# Patient Record
Sex: Male | Born: 1985 | Race: Black or African American | Hispanic: No | Marital: Single | Smoking: Current every day smoker
Health system: Southern US, Community
[De-identification: ages and names within clinical notes are randomized; demographics above are authoritative.]

## PROBLEM LIST (undated history)

## (undated) DIAGNOSIS — F32A Depression, unspecified: Secondary | ICD-10-CM

## (undated) DIAGNOSIS — Z21 Asymptomatic human immunodeficiency virus [HIV] infection status: Secondary | ICD-10-CM

## (undated) DIAGNOSIS — F329 Major depressive disorder, single episode, unspecified: Secondary | ICD-10-CM

## (undated) DIAGNOSIS — B20 Human immunodeficiency virus [HIV] disease: Secondary | ICD-10-CM

## (undated) HISTORY — PX: FOOT SURGERY: SHX648

## (undated) HISTORY — PX: HAND SURGERY: SHX662

---
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
## 2011-07-27 ENCOUNTER — Emergency Department (HOSPITAL_COMMUNITY)
Admission: EM | Admit: 2011-07-27 | Discharge: 2011-07-27 | Disposition: A | Discharge status: Home / Self Care | Payer: Self-pay | Attending: Emergency Medicine | Admitting: Emergency Medicine

## 2011-07-27 ENCOUNTER — Emergency Department (HOSPITAL_COMMUNITY): Payer: Self-pay

## 2011-07-27 DIAGNOSIS — S62329A Displaced fracture of shaft of unspecified metacarpal bone, initial encounter for closed fracture: Secondary | ICD-10-CM | POA: Insufficient documentation

## 2011-07-27 DIAGNOSIS — W219XXA Striking against or struck by unspecified sports equipment, initial encounter: Secondary | ICD-10-CM | POA: Insufficient documentation

## 2011-07-27 DIAGNOSIS — M79609 Pain in unspecified limb: Secondary | ICD-10-CM | POA: Insufficient documentation

## 2011-07-27 DIAGNOSIS — Y92838 Other recreation area as the place of occurrence of the external cause: Secondary | ICD-10-CM | POA: Insufficient documentation

## 2011-07-27 DIAGNOSIS — Y9367 Activity, basketball: Secondary | ICD-10-CM | POA: Insufficient documentation

## 2011-07-27 DIAGNOSIS — M7989 Other specified soft tissue disorders: Secondary | ICD-10-CM | POA: Insufficient documentation

## 2011-07-27 DIAGNOSIS — S6990XA Unspecified injury of unspecified wrist, hand and finger(s), initial encounter: Secondary | ICD-10-CM | POA: Insufficient documentation

## 2011-07-27 DIAGNOSIS — Y9239 Other specified sports and athletic area as the place of occurrence of the external cause: Secondary | ICD-10-CM | POA: Insufficient documentation

## 2011-08-10 ENCOUNTER — Ambulatory Visit (HOSPITAL_COMMUNITY)
Admission: RE | Admit: 2011-08-10 | Discharge: 2011-08-10 | Disposition: A | Discharge status: Home / Self Care | Payer: Self-pay | Source: Ambulatory Visit | Attending: Orthopedic Surgery | Admitting: Orthopedic Surgery

## 2014-04-12 DIAGNOSIS — Y9239 Other specified sports and athletic area as the place of occurrence of the external cause: Secondary | ICD-10-CM | POA: Insufficient documentation

## 2014-04-12 DIAGNOSIS — X500XXA Overexertion from strenuous movement or load, initial encounter: Secondary | ICD-10-CM | POA: Insufficient documentation

## 2014-04-12 DIAGNOSIS — Y9361 Activity, american tackle football: Secondary | ICD-10-CM | POA: Insufficient documentation

## 2014-04-12 DIAGNOSIS — IMO0002 Reserved for concepts with insufficient information to code with codable children: Secondary | ICD-10-CM | POA: Insufficient documentation

## 2014-04-12 DIAGNOSIS — Y92838 Other recreation area as the place of occurrence of the external cause: Secondary | ICD-10-CM

## 2014-04-13 ENCOUNTER — Emergency Department (HOSPITAL_COMMUNITY): Payer: Self-pay

## 2014-04-13 ENCOUNTER — Emergency Department (HOSPITAL_COMMUNITY)
Admission: EM | Admit: 2014-04-13 | Discharge: 2014-04-13 | Disposition: A | Discharge status: Home / Self Care | Payer: Self-pay | Attending: Emergency Medicine | Admitting: Emergency Medicine

## 2014-04-13 DIAGNOSIS — S46912A Strain of unspecified muscle, fascia and tendon at shoulder and upper arm level, left arm, initial encounter: Secondary | ICD-10-CM

## 2014-04-13 NOTE — ED Provider Notes (Signed)
CSN: [id number]     Arrival date & time 04/12/14  2334 History   First MD Initiated Contact with Patient 04/13/14 0205     Chief Complaint  Patient presents with  . Shoulder Injury     (Consider location/radiation/quality/duration/timing/severity/associated sxs/prior Treatment) HPI Comments: 28 year old healthy male presents with left shoulder pain. Patient was playing football and was on the ground in his left arm twisted back and felt pain radiating down his arm and subluxation. No history of shoulder surgeries. Pain with range of motion. No other injuries. No current prior to arrival  Patient is a 28 y.o. male presenting with shoulder injury. The history is provided by the patient.  Shoulder Injury    No past medical history on file. No past surgical history on file. No family history on file. History  Substance Use Topics  . Smoking status: Not on file  . Smokeless tobacco: Not on file  . Alcohol Use: Not on file    Review of Systems  Musculoskeletal: Positive for arthralgias. Negative for joint swelling.  Neurological: Negative for weakness and numbness.      Allergies  Review of patient's allergies indicates not on file.  Home Medications   Prior to Admission medications   Not on File   BP 133/80  Pulse 65  Temp(Src) 98.2 F (36.8 C) (Oral)  Resp 16  Ht 5\' 11"  (1.803 m)  Wt 196 lb 6 oz (89.075 kg)  BMI 27.40 kg/m2  SpO2 99% Physical Exam  Nursing note and vitals reviewed. Constitutional: He appears well-developed and well-nourished. No distress.  HENT:  Head: Normocephalic and atraumatic.  Cardiovascular: Normal rate.   Pulmonary/Chest: Effort normal.  Musculoskeletal: He exhibits tenderness. He exhibits no edema.  Patient has tenderness mild anterior left shoulder joint, full range of motion of the shoulder with pain with internal range of motion, neurovascularly intact distal arm  Neurological: He is alert.  Skin: Skin is warm.    ED Course   Procedures (including critical care time) Labs Review Labs Reviewed - No data to display  Imaging Review Dg Shoulder Left  04/13/2014   CLINICAL DATA:  Landed on left shoulder while playing football. Left anterior shoulder pain.  EXAM: LEFT SHOULDER - 2+ VIEW  COMPARISON:  None.  FINDINGS: There is no evidence of fracture or dislocation. The left humeral head is seated within the glenoid fossa. The acromioclavicular joint is unremarkable in appearance. No significant soft tissue abnormalities are seen. The visualized portions of the left lung are clear.  IMPRESSION: No evidence of fracture or dislocation.   Electronically Signed   By: Roanna Raider M.D.   On: 04/13/2014 00:48     EKG Interpretation None      MDM   Final diagnoses:  Left shoulder strain   Low risk injury with no fracture on x-ray. X-ray reviewed. Discussed supportive care and ibuprofen. Musculoskeletal strain versus subluxation.  Results and differential diagnosis were discussed with the patient/parent/guardian. Close follow up outpatient was discussed, comfortable with the plan.   Filed Vitals:   04/13/14 0008  BP: 133/80  Pulse: 65  Temp: 98.2 F (36.8 C)  TempSrc: Oral  Resp: 16  Height: 5\' 11"  (1.803 m)  Weight: 196 lb 6 oz (89.075 kg)  SpO2: 99%   Or her      Enid Skeens, MD 04/13/14 [phone]

## 2014-04-13 NOTE — ED Notes (Signed)
Pt states that he is having left shoulder pain following playing football earlier today.  Pt states he has plate and rods in his left arm from previous injuries.  Pt talking in clear complete sentences

## 2014-04-13 NOTE — Discharge Instructions (Signed)
Take ibuprofen every 6 hours as needed for pain and inflammation Use ice initially and then gradually increase range of motion and stretches.  If you were given medicines take as directed.  If you are on coumadin or contraceptives realize their levels and effectiveness is altered by many different medicines.  If you have any reaction (rash, tongues swelling, other) to the medicines stop taking and see a physician.   Please follow up as directed and return to the ER or see a physician for new or worsening symptoms.  Thank you. Filed Vitals:   04/13/14 0008  BP: 133/80  Pulse: 65  Temp: 98.2 F (36.8 C)  TempSrc: Oral  Resp: 16  Height: 5\' 11"  (1.803 m)  Weight: 196 lb 6 oz (89.075 kg)  SpO2: 99%   Pupils are
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
## 2017-07-02 ENCOUNTER — Encounter: Payer: Self-pay | Admitting: Internal Medicine

## 2017-07-02 ENCOUNTER — Other Ambulatory Visit: Payer: Self-pay | Admitting: Internal Medicine

## 2017-07-02 ENCOUNTER — Ambulatory Visit (INDEPENDENT_AMBULATORY_CARE_PROVIDER_SITE_OTHER): Payer: Self-pay | Admitting: Licensed Clinical Social Worker

## 2017-07-02 ENCOUNTER — Ambulatory Visit (INDEPENDENT_AMBULATORY_CARE_PROVIDER_SITE_OTHER): Payer: Self-pay | Admitting: Internal Medicine

## 2017-07-02 VITALS — BP 95/56 | HR 53 | Temp 98.3°F | Wt 179.0 lb

## 2017-07-02 DIAGNOSIS — Z23 Encounter for immunization: Secondary | ICD-10-CM

## 2017-07-02 DIAGNOSIS — B20 Human immunodeficiency virus [HIV] disease: Secondary | ICD-10-CM

## 2017-07-02 DIAGNOSIS — F4320 Adjustment disorder, unspecified: Secondary | ICD-10-CM

## 2017-07-02 DIAGNOSIS — F32A Depression, unspecified: Secondary | ICD-10-CM

## 2017-07-02 DIAGNOSIS — F329 Major depressive disorder, single episode, unspecified: Secondary | ICD-10-CM

## 2017-07-02 LAB — CBC WITH DIFFERENTIAL/PLATELET
Basophils Absolute: 0 cells/uL (ref 0–200)
Basophils Relative: 0 %
Eosinophils Absolute: 74 cells/uL (ref 15–500)
Eosinophils Relative: 2 %
HCT: 47.2 % (ref 38.5–50.0)
Hemoglobin: 15.9 g/dL (ref 13.2–17.1)
Lymphocytes Relative: 55 %
Lymphs Abs: 2035 cells/uL (ref 850–3900)
MCH: 30.3 pg (ref 27.0–33.0)
MCHC: 33.7 g/dL (ref 32.0–36.0)
MCV: 90.1 fL (ref 80.0–100.0)
MPV: 8.9 fL (ref 7.5–12.5)
Monocytes Absolute: 592 cells/uL (ref 200–950)
Monocytes Relative: 16 %
Neutro Abs: 999 cells/uL — ABNORMAL LOW (ref 1500–7800)
Neutrophils Relative %: 27 %
Platelets: 230 10*3/uL (ref 140–400)
RBC: 5.24 MIL/uL (ref 4.20–5.80)
RDW: 15.4 % — ABNORMAL HIGH (ref 11.0–15.0)
WBC: 3.7 10*3/uL — ABNORMAL LOW (ref 3.8–10.8)

## 2017-07-02 MED ORDER — BICTEGRAVIR-EMTRICITAB-TENOFOV 50-200-25 MG PO TABS: 1.0000 | ORAL_TABLET | Freq: Every day | ORAL | 5 refills | Status: DC

## 2017-07-02 MED FILL — BIKTARVY 50-200-25 MG TABS: 50-200-25 | 30 days supply | Qty: 30 | Fill #0

## 2017-07-02 NOTE — Progress Notes (Signed)
RFV: newly diagnosed  hiv patient Patient ID: George Rivers, male   DOB: 07/16/1986, 31 y.o.   MRN: [id number]  HPI George Rivers is a 31yo AAM with newly diagnosed HIV disease this Summer 2018. He has had sporadic testing as an adult, he last tested for hiv disease in 2011 with a negative test. He went for STI/HIV testing in July 2018 which was positive. He was tested since his girlfriend had chlamydia but he was not symptomatic at that time.  RF = has sex with women. He has disclosed to his girlfriend that he has HIV. In the last 3 months had 2 partners. In 2018 - only 3 partners. Does not recall any of his partners being HIV + ( but had a "girlfriend who had HIV+baby daddy" several yearsr ago). ? Prison tattoos.   Soc HX: no illicit drugs, no IVDU, does rarely drink, but smokes 1ppd. works at [employer], at [employer] . 40hr/ week.   ROS: + nightsweat, loss of appetite, loss of weight #20 in the last month (baseline weight 98)  Pmhx: -anxiety/depress  No outpatient encounter prescriptions on file as of 07/02/2017.   No facility-administered encounter medications on file as of 07/02/2017.      There are no active problems to display for this patient.  Sochx: Social History  Substance Use Topics  . Smoking status: Current Every Day Smoker    Types: Cigarettes  . Smokeless tobacco: Former Neurosurgeon  . Alcohol use Yes     Comment: Quitting    Family hx: Grandmother schizophrenia Mother - healthy Father - died of stroke, hypertension  Health Maintenance Due  Topic Date Due  . HIV Screening  03/13/2002  . TETANUS/TDAP  03/13/2006  . INFLUENZA VACCINE  06/19/2017     Physical Exam   BP (!) 95/56   Pulse (!) 53   Temp 98.3 F (36.8 C)   Wt 179 lb (81.2 kg)   BMI 24.97 kg/m   Physical Exam  Constitutional: He is oriented to person, place, and time. He appears well-developed and well-nourished. No distress.  HENT:  Mouth/Throat: Oropharynx is clear and moist. No oropharyngeal  exudate.  Cardiovascular: Normal rate, regular rhythm and normal heart sounds. Exam reveals no gallop and no friction rub.  No murmur heard.  Pulmonary/Chest: Effort normal and breath sounds normal. No respiratory distress. He has no wheezes.  Abdominal: Soft. Bowel sounds are normal. He exhibits no distension. There is no tenderness.  Lymphadenopathy:  He has no cervical adenopathy.  Neurological: He is alert and oriented to person, place, and time.  Skin: Skin is warm and dry. No rash noted. No erythema.  Psychiatric: He has a normal mood and affect. His behavior is normal.   Labs: 06/12/2017 HIV+  Assessment and Plan   hiv disease = spent majority of visit discussing hiv disease, transmission risk, and treatment. We will get baseline labs. Plan to start biktarvy. Will have him meet with clinical pharmacist to help out with adherence counseling  Health maintenance = hep  b #1 and pneumococcal vacccine today. Reviewed his state imms records, where he had only received 2 hep B vaccines. Will check hep B S Ab.  Risk of transmission = gave recs on minimize risk of giving hiv to his sexual partners presently since he is off of treatment.   Depression/anxiety = will have him meet with clinic counselor for support  Spent 60 min with greater than 50% in face to face time counseling in regards  to hiv disease
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
## 2017-07-02 NOTE — Progress Notes (Signed)
HPI: Livingston DionesMarcus L Orrison is a 31 y.o. male who presents to the [facility] clinic today as a new HIV patient to initiate care with Dr. Drue SecondSnider.  Allergies: Not on File  Past Medical History: No past medical history on file.  Social History: Social History   Social History  . Marital status: Single    Spouse name: N/A  . Number of children: N/A  . Years of education: N/A   Social History Main Topics  . Smoking status: Current Every Day Smoker    Types: Cigarettes  . Smokeless tobacco: Former NeurosurgeonUser  . Alcohol use Yes     Comment: Quitting   . Drug use: Yes    Types: Marijuana  . Sexual activity: No   Other Topics Concern  . None   Social History Narrative  . None    Current Regimen: None  Assessment: Berna SpareMarcus is here today to initiate care for his newly diagnosed HIV infection.  He tested HIV + in July at the health department.  I spent some time going over the different medication options we have for HIV. We do not have any baseline labs on him available today.  After discussion with Dr. Drue SecondSnider and the patient, we will start Biktarvy.  He has no insurance, so he will apply for ADAP with Marcelino DusterMichelle.  I called and got a 30 day supply of Biktarvy for him through [facility].  I spent time going over [place] with him and the importance of not missing any doses.  I discussed resistance and what can happen if he decides to take it on and off.  He states he is ready to start today and will take it every day.  He is on no other medications. I also spent some time explaining HIV viral loads and his CD4 count and immune system.  He asked if Susanne BordersBiktarvy will help him gain weight, and I explained that being on medications to suppress his viral load and increase his CD4 count will help with that all together.  I also went over the most common side effects with Biktarvy and gave him my card to call me if he needs anything.  I will bring him back in 3 weeks to make sure there is no gap between his 30 day supply  and ADAP approval.   Plans: - Start Biktarvy PO once daily - HIV labs today per Dr. Drue SecondSnider - [facility]  Radley Barto L. Jahna Liebert, PharmD, CPP Infectious Diseases Clinical Pharmacist [facility] 07/02/2017, 11:13 AM

## 2017-07-03 DIAGNOSIS — F329 Major depressive disorder, single episode, unspecified: Secondary | ICD-10-CM | POA: Insufficient documentation

## 2017-07-03 DIAGNOSIS — B20 Human immunodeficiency virus [HIV] disease: Secondary | ICD-10-CM | POA: Insufficient documentation

## 2017-07-03 DIAGNOSIS — F32A Depression, unspecified: Secondary | ICD-10-CM | POA: Insufficient documentation

## 2017-07-03 LAB — T-HELPER CELL (CD4) - (RCID CLINIC ONLY)
CD4 % Helper T Cell: 17 % — ABNORMAL LOW (ref 33–55)
CD4 T CELL ABS: 370 /uL — AB (ref 400–2700)

## 2017-07-03 LAB — COMPLETE METABOLIC PANEL WITH GFR
ALBUMIN: 4.3 g/dL (ref 3.6–5.1)
ALK PHOS: 60 U/L (ref 40–115)
ALT: 25 U/L (ref 9–46)
AST: 28 U/L (ref 10–40)
BUN: 13 mg/dL (ref 7–25)
CALCIUM: 9.7 mg/dL (ref 8.6–10.3)
CHLORIDE: 107 mmol/L (ref 98–110)
CO2: 26 mmol/L (ref 20–32)
CREATININE: 1.16 mg/dL (ref 0.60–1.35)
GFR, Est African American: >89 mL/min (ref >=60)
GFR, Est Non African American: 84 mL/min (ref >=60)
GLUCOSE: 95 mg/dL (ref 65–99)
Potassium: 4.5 mmol/L (ref 3.5–5.3)
SODIUM: 143 mmol/L (ref 135–146)
Total Bilirubin: 0.8 mg/dL (ref 0.2–1.2)
Total Protein: 7.3 g/dL (ref 6.1–8.1)

## 2017-07-03 LAB — HEPATITIS B SURFACE ANTIBODY,QUALITATIVE: Hep B S Ab: REACTIVE — AB

## 2017-07-03 LAB — HEPATITIS C ANTIBODY: HCV AB: NONREACTIVE

## 2017-07-03 LAB — HEPATITIS A ANTIBODY, TOTAL: Hep A Total Ab: REACTIVE — AB

## 2017-07-03 LAB — RPR

## 2017-07-03 LAB — HEPATITIS B SURFACE ANTIGEN: Hepatitis B Surface Ag: NONREACTIVE

## 2017-07-05 LAB — HIV-1 RNA,QN PCR W/REFLEX GENOTYPE
HIV-1 RNA, QN PCR: 22100 {copies}/mL — AB
HIV-1 RNA, QN PCR: 4.34 {Log_copies}/mL — AB

## 2017-07-24 ENCOUNTER — Ambulatory Visit: Payer: Medicaid Other

## 2017-07-29 ENCOUNTER — Ambulatory Visit: Payer: Self-pay

## 2017-07-31 ENCOUNTER — Other Ambulatory Visit: Payer: Self-pay | Admitting: *Deleted

## 2017-07-31 DIAGNOSIS — B20 Human immunodeficiency virus [HIV] disease: Secondary | ICD-10-CM

## 2017-07-31 MED ORDER — BICTEGRAVIR-EMTRICITAB-TENOFOV 50-200-25 MG PO TABS: 1.0000 | ORAL_TABLET | Freq: Every day | ORAL | 1 refills | Status: DC

## 2017-07-31 MED ORDER — BICTEGRAVIR-EMTRICITAB-TENOFOV 50-200-25 MG PO TABS: 1.0000 | ORAL_TABLET | Freq: Every day | ORAL | 5 refills | Status: DC

## 2017-08-05 ENCOUNTER — Other Ambulatory Visit: Payer: Self-pay

## 2017-08-05 ENCOUNTER — Other Ambulatory Visit: Payer: Self-pay | Admitting: *Deleted

## 2017-08-05 DIAGNOSIS — B20 Human immunodeficiency virus [HIV] disease: Secondary | ICD-10-CM

## 2017-08-05 MED ORDER — BICTEGRAVIR-EMTRICITAB-TENOFOV 50-200-25 MG PO TABS: 1.0000 | ORAL_TABLET | Freq: Every day | ORAL | 5 refills | Status: DC

## 2017-08-06 LAB — T-HELPER CELL (CD4) - (RCID CLINIC ONLY)
CD4 % Helper T Cell: 14 % — ABNORMAL LOW (ref 33–55)
CD4 T CELL ABS: 280 /uL — AB (ref 400–2700)

## 2017-08-07 LAB — HIV-1 RNA QUANT-NO REFLEX-BLD
HIV 1 RNA Quant: <20 copies/mL
HIV-1 RNA Quant, Log: <1.3 Log copies/mL

## 2017-08-12 ENCOUNTER — Telehealth: Payer: Self-pay | Admitting: Pharmacy Technician

## 2017-08-12 NOTE — Telephone Encounter (Signed)
The pharmacy and I called all phone numbers multiple times and dates.    No voicemail, no answer.    Overdue to refill Biktarvy by 11 days.

## 2017-08-19 LAB — HIV-1 GENOTYPE: HIV-1 Genotype: DETECTED — AB

## 2017-08-20 ENCOUNTER — Ambulatory Visit: Payer: Medicaid Other | Admitting: Internal Medicine

## 2017-08-26 ENCOUNTER — Encounter: Payer: Self-pay | Admitting: Internal Medicine

## 2017-08-31 ENCOUNTER — Emergency Department (HOSPITAL_COMMUNITY)
Admission: EM | Admit: 2017-08-31 | Discharge: 2017-08-31 | Disposition: A | Discharge status: Home / Self Care | Payer: Self-pay | Attending: Emergency Medicine | Admitting: Emergency Medicine

## 2017-08-31 ENCOUNTER — Encounter (HOSPITAL_COMMUNITY): Payer: Self-pay | Admitting: Emergency Medicine

## 2017-08-31 DIAGNOSIS — Z23 Encounter for immunization: Secondary | ICD-10-CM | POA: Insufficient documentation

## 2017-08-31 DIAGNOSIS — Y929 Unspecified place or not applicable: Secondary | ICD-10-CM | POA: Insufficient documentation

## 2017-08-31 DIAGNOSIS — W228XXA Striking against or struck by other objects, initial encounter: Secondary | ICD-10-CM | POA: Insufficient documentation

## 2017-08-31 DIAGNOSIS — Y9389 Activity, other specified: Secondary | ICD-10-CM | POA: Insufficient documentation

## 2017-08-31 DIAGNOSIS — F17210 Nicotine dependence, cigarettes, uncomplicated: Secondary | ICD-10-CM | POA: Insufficient documentation

## 2017-08-31 DIAGNOSIS — Y999 Unspecified external cause status: Secondary | ICD-10-CM | POA: Insufficient documentation

## 2017-08-31 DIAGNOSIS — S0101XA Laceration without foreign body of scalp, initial encounter: Secondary | ICD-10-CM | POA: Insufficient documentation

## 2017-08-31 DIAGNOSIS — Z79899 Other long term (current) drug therapy: Secondary | ICD-10-CM | POA: Insufficient documentation

## 2017-08-31 MED ORDER — TETANUS-DIPHTH-ACELL PERTUSSIS 5-2.5-18.5 LF-MCG/0.5 IM SUSP
0.5000 mL | Freq: Once | INTRAMUSCULAR | Status: AC
  Administered 2017-08-31: 0.5 mL via INTRAMUSCULAR
  Filled 2017-08-31: qty 0.5

## 2017-08-31 MED ORDER — IBUPROFEN 400 MG PO TABS
400.0000 mg | ORAL_TABLET | Freq: Once | ORAL | Status: AC | PRN
  Administered 2017-08-31: 400 mg via ORAL
  Filled 2017-08-31: qty 1

## 2017-08-31 MED ORDER — IBUPROFEN 400 MG PO TABS
ORAL_TABLET | ORAL | Status: AC
  Administered 2017-08-31: 400 mg
  Filled 2017-08-31: qty 1

## 2017-08-31 NOTE — ED Provider Notes (Signed)
[facility]-EMERGENCY DEPT Provider Note   CSN: [id number] Arrival date & time: 08/31/17  1857     History   Chief Complaint Chief Complaint  Patient presents with  . Head Laceration    HPI George Rivers is a 31 y.o. male.  HPI George Rivers is a 31 y.o. male with recent diagnosis of HIV disease, on medications, presents to emergency department complaining of laceration to the forehead. Patient states he walked into the back of the open trunk of the van which lacerated his forehead. He denies loss of consciousness. No headache. No nausea or vomiting. No amnesia. No confusion. Not anticoagulated. Pressure applied for bleeding. No history of head trauma.  History reviewed. No pertinent past medical history.  Patient Active Problem List   Diagnosis Date Noted  . HIV disease (HCC) 07/03/2017  . Depression 07/03/2017    History reviewed. No pertinent surgical history.     Home Medications    Prior to Admission medications   Medication Sig Start Date End Date Taking? Authorizing Provider  bictegravir-emtricitabine-tenofovir AF (BIKTARVY) 50-200-25 MG TABS tablet Take 1 tablet by mouth daily. 08/05/17   Randall Hiss, MD    Family History History reviewed. No pertinent family history.  Social History Social History  Substance Use Topics  . Smoking status: Current Every Day Smoker    Types: Cigarettes  . Smokeless tobacco: Former Neurosurgeon  . Alcohol use Yes     Comment: Quitting      Allergies   Patient has no known allergies.   Review of Systems Review of Systems  Constitutional: Negative for chills and fever.  Skin: Negative for color change and wound.  Neurological: Negative for dizziness, weakness, light-headedness and headaches.  Psychiatric/Behavioral: Negative for confusion.  All other systems reviewed and are negative.    Physical Exam Updated Vital Signs BP 120/71 (BP Location: Left Arm)   Pulse 87   Temp 98.6 F (37 C) (Oral)   Resp 16    Ht  (1.778 m)   Wt 81.2 kg (179 lb)   SpO2 94%   BMI 25.68 kg/m   Physical Exam  Constitutional: He is oriented to person, place, and time. He appears well-developed and well-nourished. No distress.  HENT:  Head: Normocephalic.  2 cm abrasion at the hairline of the forehead and the scalp. Hemostatic at this time. Non-gaping.  Eyes: Conjunctivae are normal.  Neck: Neck supple.  Cardiovascular: Normal rate.   Pulmonary/Chest: No respiratory distress.  Abdominal: He exhibits no distension.  Neurological: He is alert and oriented to person, place, and time. No cranial nerve deficit.  5/5 and equal upper and lower extremity strength bilaterally. Equal grip strength bilaterally. Normal finger to nose and heel to shin. No pronator drift. Patellar reflexes 2+   Skin: Skin is warm and dry.  Nursing note and vitals reviewed.    ED Treatments / Results  Labs (all labs ordered are listed, but only abnormal results are displayed) Labs Reviewed - No data to display  EKG  EKG Interpretation None       Radiology No results found.  Procedures Procedures (including critical care time)  Medications Ordered in ED Medications  ibuprofen (ADVIL,MOTRIN) tablet 400 mg (not administered)  Tdap (BOOSTRIX) injection 0.5 mL (not administered)  ibuprofen (ADVIL,MOTRIN) 400 MG tablet (400 mg  Given 08/31/17 1910)     Initial Impression / Assessment and Plan / ED Course  I have reviewed the triage vital signs and the nursing notes.  Pertinent labs & imaging results that were available during my care of the patient were reviewed by me and considered in my medical decision making (see chart for details).     Patient with small abrasion to the forehead, repaired with Dermabond. No sutures needed. Tetanus updated. I do not think he needs any imaging at this time, no neurological symptoms, no loss of consciousness, no exam findings to suggest imaging. Home with wound care, follow-up as  needed.  Vitals:   08/31/17 1900 08/31/17 1906  BP: 120/71   Pulse: 87   Resp: 16   Temp: 98.6 F (37 C)   TempSrc: Oral   SpO2: 94%   Weight:  81.2 kg (179 lb)  Height:   (1.778 m)     Final Clinical Impressions(s) / ED Diagnoses   Final diagnoses:  Laceration of scalp, initial encounter    New Prescriptions New Prescriptions   No medications on file     Jaynie Crumble, Cordelia Poche 08/31/17 1944    Bethann Berkshire, MD 08/31/17 2329

## 2017-08-31 NOTE — ED Triage Notes (Signed)
Pt hit a shelf on a store by accident and has a small laceration on his mid forehead, bleeding controlled, pt c/o 7/10 throbbing pain.

## 2017-08-31 NOTE — Discharge Instructions (Signed)
Keep your wound clean and dry. Tissue adhesive will follow off on its own in several days. Follow-up as needed.

## 2017-10-24 ENCOUNTER — Ambulatory Visit: Payer: Self-pay | Admitting: Internal Medicine

## 2017-12-29 ENCOUNTER — Emergency Department (HOSPITAL_COMMUNITY)
Admission: EM | Admit: 2017-12-29 | Discharge: 2017-12-29 | Disposition: A | Discharge status: Home / Self Care | Payer: Self-pay | Attending: Emergency Medicine | Admitting: Emergency Medicine

## 2017-12-29 ENCOUNTER — Encounter (HOSPITAL_COMMUNITY): Payer: Self-pay | Admitting: Emergency Medicine

## 2017-12-29 ENCOUNTER — Other Ambulatory Visit: Payer: Self-pay

## 2017-12-29 DIAGNOSIS — M545 Low back pain: Secondary | ICD-10-CM | POA: Insufficient documentation

## 2017-12-29 DIAGNOSIS — Z5321 Procedure and treatment not carried out due to patient leaving prior to being seen by health care provider: Secondary | ICD-10-CM | POA: Insufficient documentation

## 2017-12-29 HISTORY — DX: Asymptomatic human immunodeficiency virus (hiv) infection status: Z21

## 2017-12-29 HISTORY — DX: Human immunodeficiency virus (HIV) disease: B20

## 2017-12-29 NOTE — ED Triage Notes (Signed)
Pt. Stated, I fell down some steps last night and I hurt my lower back.

## 2017-12-29 NOTE — ED Notes (Addendum)
Patient just walked out of room and stated he was leaving at this time. Patient did not stop to talk about it or sign any papers. MD notified.

## 2018-03-12 ENCOUNTER — Other Ambulatory Visit: Payer: Self-pay

## 2018-03-12 ENCOUNTER — Telehealth: Payer: Self-pay

## 2018-03-12 DIAGNOSIS — B20 Human immunodeficiency virus [HIV] disease: Secondary | ICD-10-CM

## 2018-03-12 MED ORDER — BICTEGRAVIR-EMTRICITAB-TENOFOV 50-200-25 MG PO TABS: 1.0000 | ORAL_TABLET | Freq: Every day | ORAL | 5 refills | Status: DC

## 2018-03-12 NOTE — Telephone Encounter (Signed)
Pt called today stating that the pharmacy needed orders for the pt to get Biktarvy. Medical records show that the pt currently has an order for his meds with five refills remaining.  [facility] order and stated that they did not see any refills or scripts for the pt.  Pt stated he would call us back if the had any more issues getting his meds.  George CourierJose L Obaloluwa Rivers, [place]

## 2018-04-09 ENCOUNTER — Other Ambulatory Visit: Payer: Self-pay

## 2018-04-09 ENCOUNTER — Other Ambulatory Visit: Payer: Self-pay | Admitting: *Deleted

## 2018-04-09 ENCOUNTER — Encounter: Payer: Self-pay | Admitting: Internal Medicine

## 2018-04-09 DIAGNOSIS — B20 Human immunodeficiency virus [HIV] disease: Secondary | ICD-10-CM

## 2018-04-09 DIAGNOSIS — Z113 Encounter for screening for infections with a predominantly sexual mode of transmission: Secondary | ICD-10-CM

## 2018-04-09 MED ORDER — BICTEGRAVIR-EMTRICITAB-TENOFOV 50-200-25 MG PO TABS: 1.0000 | ORAL_TABLET | Freq: Every day | ORAL | 5 refills | Status: DC

## 2018-04-10 LAB — CBC WITH DIFFERENTIAL/PLATELET
BASOS PCT: 0.2 %
Basophils Absolute: 12 cells/uL (ref 0–200)
EOS ABS: 18 {cells}/uL (ref 15–500)
Eosinophils Relative: 0.3 %
HCT: 46 % (ref 38.5–50.0)
HEMOGLOBIN: 16 g/dL (ref 13.2–17.1)
Lymphs Abs: 1587 cells/uL (ref 850–3900)
MCH: 31.7 pg (ref 27.0–33.0)
MCHC: 34.8 g/dL (ref 32.0–36.0)
MCV: 91.1 fL (ref 80.0–100.0)
MONOS PCT: 10.1 %
MPV: 9.1 fL (ref 7.5–12.5)
Neutro Abs: 3688 cells/uL (ref 1500–7800)
Neutrophils Relative %: 62.5 %
PLATELETS: 288 10*3/uL (ref 140–400)
RBC: 5.05 10*6/uL (ref 4.20–5.80)
RDW: 14.1 % (ref 11.0–15.0)
Total Lymphocyte: 26.9 %
WBC mixed population: 596 cells/uL (ref 200–950)
WBC: 5.9 10*3/uL (ref 3.8–10.8)

## 2018-04-10 LAB — COMPLETE METABOLIC PANEL WITH GFR
AG RATIO: 1.8 (calc) (ref 1.0–2.5)
ALKALINE PHOSPHATASE (APISO): 67 U/L (ref 40–115)
ALT: 15 U/L (ref 9–46)
AST: 20 U/L (ref 10–40)
Albumin: 4.8 g/dL (ref 3.6–5.1)
BILIRUBIN TOTAL: 0.7 mg/dL (ref 0.2–1.2)
BUN: 10 mg/dL (ref 7–25)
CHLORIDE: 101 mmol/L (ref 98–110)
CO2: 29 mmol/L (ref 20–32)
CREATININE: 1.24 mg/dL (ref 0.60–1.35)
Calcium: 10.1 mg/dL (ref 8.6–10.3)
GFR, Est African American: 89 mL/min/{1.73_m2} (ref >=60)
GFR, Est Non African American: 77 mL/min/{1.73_m2} (ref >=60)
Globulin: 2.7 g/dL (calc) (ref 1.9–3.7)
Glucose, Bld: 77 mg/dL (ref 65–99)
Potassium: 4.4 mmol/L (ref 3.5–5.3)
Sodium: 138 mmol/L (ref 135–146)
TOTAL PROTEIN: 7.5 g/dL (ref 6.1–8.1)

## 2018-04-10 LAB — T-HELPER CELL (CD4) - (RCID CLINIC ONLY)
CD4 T CELL HELPER: 25 % — AB (ref 33–55)
CD4 T Cell Abs: 410 /uL (ref 400–2700)

## 2018-04-10 LAB — RPR: RPR: NONREACTIVE

## 2018-04-11 ENCOUNTER — Encounter: Payer: Self-pay | Admitting: Internal Medicine

## 2018-04-11 ENCOUNTER — Telehealth: Payer: Self-pay

## 2018-04-11 ENCOUNTER — Other Ambulatory Visit: Payer: Self-pay | Admitting: Pharmacist Clinician (PhC)/ Clinical Pharmacy Specialist

## 2018-04-11 LAB — HIV-1 RNA QUANT-NO REFLEX-BLD
HIV 1 RNA Quant: <20 copies/mL
HIV-1 RNA Quant, Log: <1.3 Log copies/mL

## 2018-04-11 MED ORDER — BICTEGRAVIR-EMTRICITAB-TENOFOV 50-200-25 MG PO TABS: 1.0000 | ORAL_TABLET | Freq: Every day | ORAL | 2 refills | Status: DC

## 2018-04-11 MED FILL — BIKTARVY 50-200-25 MG TABS: 50-200-25 | 30 days supply | Qty: 30 | Fill #0

## 2018-04-11 NOTE — Progress Notes (Signed)
Advancing Access until ADAP.  ID [id number] BIN [id number] PCN [id number] GRP [id number]

## 2018-04-11 NOTE — Telephone Encounter (Signed)
Pt called today wanting to speak to our financial counselor about ADAP. Pt stated that he just took his last tab of Biktarvy and does not want to go without medicine this weekend. Pt asked if I could call his pharmacy and see if they update his coverage yet, [facility] not [facility] any type of coverage for the pt listed.  Spoke with our pharmacist Ulyses Southward to see if he could help pt with medication refill so he does not have to wait for his ADAP to get approved.  Informed pt he would get a call back once we had figured something out for him.  George Rivers was able to get pt some Biktarvy refilled at [facility]. Pt was notified to pick up the medication at the pharmacy.  [name], [place]

## 2018-04-15 ENCOUNTER — Other Ambulatory Visit: Payer: Self-pay

## 2018-04-23 ENCOUNTER — Ambulatory Visit: Payer: Self-pay | Admitting: Internal Medicine

## 2018-05-16 ENCOUNTER — Other Ambulatory Visit: Payer: Self-pay | Admitting: Pharmacist Clinician (PhC)/ Clinical Pharmacy Specialist

## 2018-05-16 DIAGNOSIS — B20 Human immunodeficiency virus [HIV] disease: Secondary | ICD-10-CM

## 2018-05-16 MED ORDER — BICTEGRAVIR-EMTRICITAB-TENOFOV 50-200-25 MG PO TABS: 1.0000 | ORAL_TABLET | Freq: Every day | ORAL | 5 refills | Status: DC

## 2018-05-16 NOTE — Progress Notes (Signed)
HMAP is approved[facility]

## 2018-07-01 ENCOUNTER — Telehealth: Payer: Self-pay | Admitting: *Deleted

## 2018-07-01 NOTE — Telephone Encounter (Signed)
Received fax notice patient's financial assistance was about to expire. Patient was only seen once, 07/09/2017. RN gave fax to Timmothy SoursAshley Rehner, asked for her assistance in getting patient into clinic. Andree CossHowell, Chrishana Spargur M, RN

## 2018-09-17 ENCOUNTER — Encounter: Payer: Self-pay | Admitting: Internal Medicine

## 2018-09-17 ENCOUNTER — Encounter: Payer: Self-pay | Admitting: Pharmacy Technician

## 2018-09-18 ENCOUNTER — Other Ambulatory Visit: Payer: Medicaid Other

## 2018-09-18 ENCOUNTER — Telehealth: Payer: Self-pay | Admitting: Behavioral Health

## 2018-09-18 NOTE — Telephone Encounter (Signed)
Patient called to check the status of his patient assistance that he filled out yesterday.  Patient came to fill out Adap paperwork with Olegario Messier and applied for Medications assistance.  However med assistance was denied due to patient using programs in the past.  Patient will have to wait for adap approval to get medications.  Pharmacy aware. Angeline Slim RN

## 2018-09-19 ENCOUNTER — Other Ambulatory Visit: Payer: Medicaid Other

## 2018-09-19 ENCOUNTER — Other Ambulatory Visit: Payer: Self-pay | Admitting: *Deleted

## 2018-09-19 DIAGNOSIS — B20 Human immunodeficiency virus [HIV] disease: Secondary | ICD-10-CM

## 2018-09-30 ENCOUNTER — Telehealth: Payer: Self-pay | Admitting: *Deleted

## 2018-09-30 NOTE — Telephone Encounter (Signed)
Patient left message in triage asking about his medication status. RN passed message to Olegario MessierKathy, as his ADAP is pending. Olegario MessierKathy attempted to call patient. Voicemail full, unable to leave a message. Andree CossHowell, Shivaay Stormont M, RN

## 2018-10-01 ENCOUNTER — Telehealth: Payer: Self-pay | Admitting: Behavioral Health

## 2018-10-01 NOTE — Telephone Encounter (Signed)
Patient called to see if his ADAP was approved.  Informed him his UMAP was approved.  Patient states he has been off of his medications for a couple of weeks.  Explained to him he has not seen a provider in over a year and is over due for a visit.  Patient states an upcoming appointment with Dr. Drue SecondSnider 10/20/2018 and scheduled him for labs tomorrow 10/02/2018.  Did not refill his medication he states she has been out for a few weeks. Angeline SlimAshley Hill RN

## 2018-10-02 ENCOUNTER — Other Ambulatory Visit: Payer: Self-pay

## 2018-10-08 ENCOUNTER — Other Ambulatory Visit: Payer: Self-pay | Admitting: *Deleted

## 2018-10-08 ENCOUNTER — Other Ambulatory Visit: Payer: Medicaid Other

## 2018-10-08 DIAGNOSIS — B20 Human immunodeficiency virus [HIV] disease: Secondary | ICD-10-CM

## 2018-10-08 MED ORDER — BICTEGRAVIR-EMTRICITAB-TENOFOV 50-200-25 MG PO TABS: 1.0000 | ORAL_TABLET | Freq: Every day | ORAL | 0 refills | Status: DC

## 2018-10-09 LAB — T-HELPER CELL (CD4) - (RCID CLINIC ONLY)
CD4 T CELL ABS: 450 /uL (ref 400–2700)
CD4 T CELL HELPER: 21 % — AB (ref 33–55)

## 2018-10-10 LAB — COMPLETE METABOLIC PANEL WITH GFR
AG RATIO: 1.7 (calc) (ref 1.0–2.5)
ALKALINE PHOSPHATASE (APISO): 58 U/L (ref 40–115)
ALT: 19 U/L (ref 9–46)
AST: 24 U/L (ref 10–40)
Albumin: 4.7 g/dL (ref 3.6–5.1)
BILIRUBIN TOTAL: 0.4 mg/dL (ref 0.2–1.2)
BUN: 15 mg/dL (ref 7–25)
CHLORIDE: 104 mmol/L (ref 98–110)
CO2: 29 mmol/L (ref 20–32)
Calcium: 10 mg/dL (ref 8.6–10.3)
Creat: 1.22 mg/dL (ref 0.60–1.35)
GFR, Est African American: 91 mL/min/{1.73_m2} (ref >=60)
GFR, Est Non African American: 79 mL/min/{1.73_m2} (ref >=60)
GLUCOSE: 99 mg/dL (ref 65–99)
Globulin: 2.8 g/dL (calc) (ref 1.9–3.7)
POTASSIUM: 4.4 mmol/L (ref 3.5–5.3)
Sodium: 140 mmol/L (ref 135–146)
Total Protein: 7.5 g/dL (ref 6.1–8.1)

## 2018-10-10 LAB — HIV-1 RNA QUANT-NO REFLEX-BLD
HIV 1 RNA QUANT: 1130 {copies}/mL — AB
HIV-1 RNA Quant, Log: 3.05 Log copies/mL — ABNORMAL HIGH

## 2018-10-10 LAB — CBC WITH DIFFERENTIAL/PLATELET
BASOS ABS: 11 {cells}/uL (ref 0–200)
Basophils Relative: 0.3 %
EOS PCT: 2.9 %
Eosinophils Absolute: 110 cells/uL (ref 15–500)
HCT: 46.5 % (ref 38.5–50.0)
Hemoglobin: 15.9 g/dL (ref 13.2–17.1)
Lymphs Abs: 2067 cells/uL (ref 850–3900)
MCH: 30.9 pg (ref 27.0–33.0)
MCHC: 34.2 g/dL (ref 32.0–36.0)
MCV: 90.3 fL (ref 80.0–100.0)
MONOS PCT: 12.2 %
MPV: 9.7 fL (ref 7.5–12.5)
NEUTROS ABS: 1148 {cells}/uL — AB (ref 1500–7800)
NEUTROS PCT: 30.2 %
Platelets: 247 10*3/uL (ref 140–400)
RBC: 5.15 10*6/uL (ref 4.20–5.80)
RDW: 13.9 % (ref 11.0–15.0)
Total Lymphocyte: 54.4 %
WBC mixed population: 464 cells/uL (ref 200–950)
WBC: 3.8 10*3/uL (ref 3.8–10.8)

## 2018-10-20 ENCOUNTER — Encounter: Payer: Self-pay | Admitting: Internal Medicine

## 2018-10-20 ENCOUNTER — Ambulatory Visit (INDEPENDENT_AMBULATORY_CARE_PROVIDER_SITE_OTHER): Payer: Self-pay | Admitting: Licensed Clinical Social Worker

## 2018-10-20 ENCOUNTER — Ambulatory Visit (INDEPENDENT_AMBULATORY_CARE_PROVIDER_SITE_OTHER): Payer: Self-pay | Admitting: Internal Medicine

## 2018-10-20 ENCOUNTER — Other Ambulatory Visit: Payer: Self-pay | Admitting: Behavioral Health

## 2018-10-20 VITALS — BP 118/84 | HR 76 | Temp 98.0°F | Ht 71.0 in | Wt 200.0 lb

## 2018-10-20 DIAGNOSIS — B20 Human immunodeficiency virus [HIV] disease: Secondary | ICD-10-CM

## 2018-10-20 DIAGNOSIS — F331 Major depressive disorder, recurrent, moderate: Secondary | ICD-10-CM

## 2018-10-20 DIAGNOSIS — F329 Major depressive disorder, single episode, unspecified: Secondary | ICD-10-CM

## 2018-10-20 DIAGNOSIS — F32A Depression, unspecified: Secondary | ICD-10-CM

## 2018-10-20 DIAGNOSIS — Z79899 Other long term (current) drug therapy: Secondary | ICD-10-CM

## 2018-10-20 DIAGNOSIS — Z23 Encounter for immunization: Secondary | ICD-10-CM

## 2018-10-20 MED ORDER — BICTEGRAVIR-EMTRICITAB-TENOFOV 50-200-25 MG PO TABS
1.0000 | ORAL_TABLET | Freq: Every day | ORAL | 1 refills | Status: DC
Start: 2018-10-20 — End: 2018-12-12

## 2018-10-20 MED ORDER — SERTRALINE HCL 50 MG PO TABS: 50.0000 mg | ORAL_TABLET | Freq: Every day | ORAL | 11 refills | Status: DC

## 2018-10-20 NOTE — Progress Notes (Signed)
RFV: follow up for hiv disease, last seen 16 months ago  Patient ID: George Rivers, male   DOB: 08-27-1986, 32 y.o.   MRN: [id number]  HPI 32yo M with hiv disease, CD 4 count of 450/VL1130 ( nov 2019) previously on biktarvy.   Was taking his biktarvy daily up until adap ran out, was off of ART for roughly 2 wks.   2004 larceny charges while as a minor hinders him getting job sometimes  zoloft - in past  Outpatient Encounter Medications as of 10/20/2018  Medication Sig  . bictegravir-emtricitabine-tenofovir AF (BIKTARVY) 50-200-25 MG TABS tablet Take 1 tablet by mouth daily.   No facility-administered encounter medications on file as of 10/20/2018.      Patient Active Problem List   Diagnosis Date Noted  . HIV disease (HCC) 07/03/2017  . Depression 07/03/2017     Health Maintenance Due  Topic Date Due  . INFLUENZA VACCINE  06/19/2018     Review of Systems Review of Systems  Constitutional: Negative for fever, chills, diaphoresis, activity change, appetite change, fatigue and unexpected weight change.  HENT: Negative for congestion, sore throat, rhinorrhea, sneezing, trouble swallowing and sinus pressure.  Eyes: Negative for photophobia and visual disturbance.  Respiratory: Negative for cough, chest tightness, shortness of breath, wheezing and stridor.  Cardiovascular: Negative for chest pain, palpitations and leg swelling.  Gastrointestinal: Negative for nausea, vomiting, abdominal pain, diarrhea, constipation, blood in stool, abdominal distention and anal bleeding.  Genitourinary: Negative for dysuria, hematuria, flank pain and difficulty urinating.  Musculoskeletal: Negative for myalgias, back pain, joint swelling, arthralgias and gait problem.  Skin: Negative for color change, pallor, rash and wound.  Neurological: Negative for dizziness, tremors, weakness and light-headedness.  Hematological: Negative for adenopathy. Does not bruise/bleed easily.    Psychiatric/Behavioral: +dysthemia.   Physical Exam   BP 118/84   Pulse 76   Temp 98 F (36.7 C) (Oral)   Ht 5\' 11"  (1.803 m)   Wt 200 lb (90.7 kg)   BMI 27.89 kg/m   Physical Exam  Constitutional: He is oriented to person, place, and time. He appears well-developed and well-nourished. No distress.  HENT:  Mouth/Throat: Oropharynx is clear and moist. No oropharyngeal exudate.  Cardiovascular: Normal rate, regular rhythm and normal heart sounds. Exam reveals no gallop and no friction rub.  No murmur heard.  Pulmonary/Chest: Effort normal and breath sounds normal. No respiratory distress. He has no wheezes.  Abdominal: Soft. Bowel sounds are normal. He exhibits no distension. There is no tenderness.  Lymphadenopathy:  He has no cervical adenopathy.  Neurological: He is alert and oriented to person, place, and time.  Skin: Skin is warm and dry. No rash noted. No erythema.  Psychiatric: He has a normal mood and affect. His behavior is normal.    Lab Results  Component Value Date   CD4TCELL 21 (L) 10/08/2018   Lab Results  Component Value Date   CD4TABS 450 10/08/2018   CD4TABS 410 04/09/2018   CD4TABS 280 (L) 08/05/2017   Lab Results  Component Value Date   HIV1RNAQUANT 1,130 (H) 10/08/2018   Lab Results  Component Value Date   HEPBSAB REACTIVE (A) 07/02/2017   Lab Results  Component Value Date   LABRPR NON-REACTIVE 04/09/2018    CBC Lab Results  Component Value Date   WBC 3.8 10/08/2018   RBC 5.15 10/08/2018   HGB 15.9 10/08/2018   HCT 46.5 10/08/2018   PLT 247 10/08/2018   MCV 90.3 10/08/2018   MCH  30.9 10/08/2018   MCHC 34.2 10/08/2018   RDW 13.9 10/08/2018   LYMPHSABS 2,067 10/08/2018   MONOABS 592 07/02/2017   EOSABS 110 10/08/2018    BMET Lab Results  Component Value Date   NA 140 10/08/2018   K 4.4 10/08/2018   CL 104 10/08/2018   CO2 29 10/08/2018   GLUCOSE 99 10/08/2018   BUN 15 10/08/2018   CREATININE 1.22 10/08/2018   CALCIUM 10.0  10/08/2018   GFRNONAA 79 10/08/2018   GFRAA 91 10/08/2018      Assessment and Plan  hiv disease = viremia whiile off of meds. And we will repeat labs in 2 wks to see that he is virologically controlled  Depression = will try to zoloft   Health maintenance = pneumococcal and flu shot  Long term medication monitoring = will check cr to see that it is stable

## 2018-10-20 NOTE — BH Specialist Note (Signed)
Integrated Behavioral Health Initial Visit  MRN: [id number] Name: George Rivers  Number of Integrated Behavioral Health Clinician visits:: 1/6 Session Start time: 10:35am  Session End time: 10:57am Total time: 20 minutes  Type of Service: Integrated Behavioral Health- Individual/Family Interpretor:No. Interpretor Name and Language: n/a   Warm Hand Off Completed.       SUBJECTIVE: George Rivers is a 32 y.o. male accompanied by self Patient was referred by Dr. Drue SecondSnider for depressive symptoms. Patient reports the following symptoms/concerns: difficulty sleeping, mood swings, angers easily, cries a lot "for no reason", passive thoughts of death, depressed mood, intrusive worry thoughts. Duration of problem: 3-4 months; Severity of problem: moderate  OBJECTIVE: Mood: Depressed and Affect: Blunt Risk of harm to self or others: No plan to harm self or others; some passive thoughts of dying  LIFE CONTEXT: Patient reports that his family has encouraged him to get help for mood swings since he was released from prison. He states that he was on Zoloft while incarcerated but did not like the way it made him feel though he "can't say if it worked" since he was closed in his cell most of the day and "it may work different when that is not the case." He is hesitant to try Zoloft again, though. Patient states that he has had trouble finding work due to his record, and that he is frustrated with this.   GOALS ADDRESSED: Patient will: 1. Reduce symptoms of: depression  INTERVENTIONS: Interventions utilized: Motivational Interviewing and Supportive Counseling   ASSESSMENT: Patient currently experiencing insomnia, mood instability, crying spells, irritability, passive thoughts of death/dying with no active suicidal thoughts or intent, depressed mood most of the time, blunted affect, anxiety and intrusive thoughts (especailly at night). The diagnosis most consistent with these symptoms is  Major Depressive Disorder, Recurrent, Moderate.   Counselor educated patient on [facility] services available at [facility], including scheduling, scope of practice and accessibility policies. Counselor guided patient to identify his goal in seeking therapy. Patient would like to feel more in control of his emotions and not be as easily angered. He described his irritability as "getting up on the wrong side of the bed," and getting too upset at little things that wouldn't usually upset him. Counselor guided patient to identify other symptoms that are troublesome to him. Patient discussed difficulty sleeping, stating that he has too many thoughts going through his mind that keep him awake. Counselor and patient explored ways to combat this through mindfulness or relaxation practices.    Patient may benefit from ongoing CBT sessions to address depressive symptoms.  PLAN: 1. Follow up with behavioral health clinician on : 10/22/18  Angus Palmsegina , LCSW

## 2018-10-22 ENCOUNTER — Institutional Professional Consult (permissible substitution): Payer: Medicaid Other | Admitting: Licensed Clinical Social Worker

## 2018-10-23 ENCOUNTER — Institutional Professional Consult (permissible substitution): Payer: Medicaid Other | Admitting: Licensed Clinical Social Worker

## 2018-10-24 ENCOUNTER — Ambulatory Visit (INDEPENDENT_AMBULATORY_CARE_PROVIDER_SITE_OTHER): Payer: Medicaid Other | Admitting: Licensed Clinical Social Worker

## 2018-10-24 DIAGNOSIS — F331 Major depressive disorder, recurrent, moderate: Secondary | ICD-10-CM
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
## 2018-10-30 ENCOUNTER — Ambulatory Visit: Payer: Medicaid Other | Admitting: Licensed Clinical Social Worker

## 2018-11-03 ENCOUNTER — Ambulatory Visit (INDEPENDENT_AMBULATORY_CARE_PROVIDER_SITE_OTHER): Payer: Medicaid Other | Admitting: Internal Medicine

## 2018-11-03 ENCOUNTER — Ambulatory Visit (INDEPENDENT_AMBULATORY_CARE_PROVIDER_SITE_OTHER): Payer: Medicaid Other | Admitting: Licensed Clinical Social Worker

## 2018-11-03 ENCOUNTER — Encounter: Payer: Self-pay | Admitting: Internal Medicine

## 2018-11-03 VITALS — BP 119/75 | HR 78 | Temp 98.0°F | Ht 71.0 in | Wt 203.0 lb

## 2018-11-03 DIAGNOSIS — F329 Major depressive disorder, single episode, unspecified: Secondary | ICD-10-CM

## 2018-11-03 DIAGNOSIS — F32A Depression, unspecified: Secondary | ICD-10-CM

## 2018-11-03 DIAGNOSIS — B20 Human immunodeficiency virus [HIV] disease: Secondary | ICD-10-CM

## 2018-11-03 DIAGNOSIS — F331 Major depressive disorder, recurrent, moderate: Secondary | ICD-10-CM

## 2018-11-03 DIAGNOSIS — Z79899 Other long term (current) drug therapy: Secondary | ICD-10-CM

## 2018-11-03 DIAGNOSIS — R4584 Anhedonia: Secondary | ICD-10-CM

## 2018-11-03 DIAGNOSIS — R454 Irritability and anger: Secondary | ICD-10-CM

## 2018-11-03 MED ORDER — FLUOXETINE HCL 20 MG PO TABS: 20.0000 mg | ORAL_TABLET | Freq: Every day | ORAL | 3 refills | Status: DC
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
## 2018-11-03 NOTE — Progress Notes (Signed)
RFV: follow up for depression  Patient ID: George Rivers, male   DOB: 12-Feb-1986, 32 y.o.   MRN: [id number]  HPI Kye is 32yo M with hiv disease, CD 4 count 450/VL 1130 on biktarvy. Follows with regina today and wanted to discuss alternative to zoloft. He was last seen dec 2nd to get restarted on biktarvy. Still not working, looking for a job.   Soc HX: no illicit drugs, no IVDU, does rarely drink, but smokes 1ppd. works at [employer], at [employer] . 40hr/ week.  Outpatient Encounter Medications as of 11/03/2018  Medication Sig  . bictegravir-emtricitabine-tenofovir AF (BIKTARVY) 50-200-25 MG TABS tablet Take 1 tablet by mouth daily.  . sertraline (ZOLOFT) 50 MG tablet Take 1 tablet (50 mg total) by mouth daily.   No facility-administered encounter medications on file as of 11/03/2018.      Patient Active Problem List   Diagnosis Date Noted  . HIV disease (HCC) 07/03/2017  . Depression 07/03/2017     There are no preventive care reminders to display for this patient.   Review of Systems Review of Systems  Constitutional: Negative for fever, chills, diaphoresis, activity change, appetite change, fatigue and unexpected weight change.  HENT: Negative for congestion, sore throat, rhinorrhea, sneezing, trouble swallowing and sinus pressure.  Eyes: Negative for photophobia and visual disturbance.  Respiratory: Negative for cough, chest tightness, shortness of breath, wheezing and stridor.  Cardiovascular: Negative for chest pain, palpitations and leg swelling.  Gastrointestinal: Negative for nausea, vomiting, abdominal pain, diarrhea, constipation, blood in stool, abdominal distention and anal bleeding.  Genitourinary: Negative for dysuria, hematuria, flank pain and difficulty urinating.  Musculoskeletal: Negative for myalgias, back pain, joint swelling, arthralgias and gait problem.  Skin: Negative for color change, pallor, rash and wound.  Neurological: Negative for dizziness,  tremors, weakness and light-headedness.  Hematological: Negative for adenopathy. Does not bruise/bleed easily.  Psychiatric/Behavioral: + depression.    Physical Exam   BP 119/75   Pulse 78   Temp 98 F (36.7 C)   Ht 5\' 11"  (1.803 m)   Wt 203 lb (92.1 kg)   BMI 28.31 kg/m   Physical Exam  Constitutional: He is oriented to person, place, and time. He appears well-developed and well-nourished. No distress.  HENT:  Mouth/Throat: Oropharynx is clear and moist. No oropharyngeal exudate.  Cardiovascular: Normal rate, regular rhythm and normal heart sounds. Exam reveals no gallop and no friction rub.  No murmur heard.  Pulmonary/Chest: Effort normal and breath sounds normal. No respiratory distress. He has no wheezes.  Abdominal: Soft. Bowel sounds are normal. He exhibits no distension. There is no tenderness.  Lymphadenopathy:  He has no cervical adenopathy.  Neurological: He is alert and oriented to person, place, and time.  Skin: Skin is warm and dry. No rash noted. No erythema.  Psychiatric: He has a normal mood and affect. His behavior is normal.    Lab Results  Component Value Date   CD4TCELL 21 (L) 10/08/2018   Lab Results  Component Value Date   CD4TABS 450 10/08/2018   CD4TABS 410 04/09/2018   CD4TABS 280 (L) 08/05/2017   Lab Results  Component Value Date   HIV1RNAQUANT 1,130 (H) 10/08/2018   Lab Results  Component Value Date   HEPBSAB REACTIVE (A) 07/02/2017   Lab Results  Component Value Date   LABRPR NON-REACTIVE 04/09/2018    CBC Lab Results  Component Value Date   WBC 3.8 10/08/2018   RBC 5.15 10/08/2018   HGB 15.9 10/08/2018  HCT 46.5 10/08/2018   PLT 247 10/08/2018   MCV 90.3 10/08/2018   MCH 30.9 10/08/2018   MCHC 34.2 10/08/2018   RDW 13.9 10/08/2018   LYMPHSABS 2,067 10/08/2018   MONOABS 592 07/02/2017   EOSABS 110 10/08/2018    BMET Lab Results  Component Value Date   NA 140 10/08/2018   K 4.4 10/08/2018   CL 104 10/08/2018    CO2 29 10/08/2018   GLUCOSE 99 10/08/2018   BUN 15 10/08/2018   CREATININE 1.22 10/08/2018   CALCIUM 10.0 10/08/2018   GFRNONAA 79 10/08/2018   GFRAA 91 10/08/2018      Assessment and Plan  hiv disease= continue with biktarvy. Excellent adherence.   Depression = will start on prozac and follow up in 1 month to see if dose is appropriate. Recommend to continue to follow up with counseling weekly in the meantime.  Long term medication adherence = cr is stable;  continue with biktarvy

## 2018-11-10 ENCOUNTER — Ambulatory Visit: Payer: Medicaid Other | Admitting: Internal Medicine

## 2018-11-17 ENCOUNTER — Other Ambulatory Visit: Payer: Medicaid Other

## 2018-11-25 ENCOUNTER — Other Ambulatory Visit: Payer: Medicaid Other

## 2018-11-26 ENCOUNTER — Encounter: Payer: Medicaid Other | Admitting: Internal Medicine

## 2018-12-12 ENCOUNTER — Other Ambulatory Visit: Payer: Self-pay

## 2018-12-12 DIAGNOSIS — B20 Human immunodeficiency virus [HIV] disease: Secondary | ICD-10-CM

## 2018-12-12 MED ORDER — BICTEGRAVIR-EMTRICITAB-TENOFOV 50-200-25 MG PO TABS
1.0000 | ORAL_TABLET | Freq: Every day | ORAL | 0 refills | Status: DC
Start: 2018-12-12 — End: 2019-05-18

## 2018-12-15 ENCOUNTER — Other Ambulatory Visit: Payer: Medicaid Other

## 2018-12-15 DIAGNOSIS — B20 Human immunodeficiency virus [HIV] disease: Secondary | ICD-10-CM

## 2018-12-17 LAB — HIV-1 RNA QUANT-NO REFLEX-BLD
HIV 1 RNA Quant: <20 copies/mL
HIV-1 RNA QUANT, LOG: NOT DETECTED {Log_copies}/mL

## 2018-12-29 ENCOUNTER — Encounter: Payer: Medicaid Other | Admitting: Internal Medicine

## 2018-12-29 ENCOUNTER — Ambulatory Visit: Payer: Medicaid Other | Admitting: Licensed Clinical Social Worker

## 2018-12-29 ENCOUNTER — Ambulatory Visit: Payer: Medicaid Other

## 2018-12-30 ENCOUNTER — Ambulatory Visit: Payer: Medicaid Other

## 2018-12-30 ENCOUNTER — Ambulatory Visit: Payer: Medicaid Other | Admitting: Licensed Clinical Social Worker

## 2019-01-19 ENCOUNTER — Encounter: Payer: Self-pay | Admitting: Internal Medicine

## 2019-02-02 ENCOUNTER — Ambulatory Visit: Payer: Medicaid Other | Admitting: Infectious Diseases

## 2019-02-09 ENCOUNTER — Ambulatory Visit (INDEPENDENT_AMBULATORY_CARE_PROVIDER_SITE_OTHER): Payer: Self-pay

## 2019-02-09 ENCOUNTER — Other Ambulatory Visit: Payer: Self-pay

## 2019-02-09 ENCOUNTER — Ambulatory Visit (HOSPITAL_COMMUNITY)
Admission: EM | Admit: 2019-02-09 | Discharge: 2019-02-09 | Disposition: A | Discharge status: Home / Self Care | Payer: Self-pay | Attending: Internal Medicine | Admitting: Internal Medicine

## 2019-02-09 ENCOUNTER — Encounter (HOSPITAL_COMMUNITY): Payer: Self-pay

## 2019-02-09 ENCOUNTER — Ambulatory Visit (HOSPITAL_COMMUNITY): Payer: Self-pay

## 2019-02-09 DIAGNOSIS — S6991XA Unspecified injury of right wrist, hand and finger(s), initial encounter: Secondary | ICD-10-CM

## 2019-02-09 DIAGNOSIS — S62501A Fracture of unspecified phalanx of right thumb, initial encounter for closed fracture: Secondary | ICD-10-CM

## 2019-02-09 MED ORDER — TRAMADOL HCL 50 MG PO TABS: 50.0000 mg | ORAL_TABLET | Freq: Four times a day (QID) | ORAL | 0 refills | Status: DC | PRN

## 2019-02-09 MED ORDER — HYDROCODONE-ACETAMINOPHEN 5-325 MG PO TABS: 1.0000 | ORAL_TABLET | ORAL | 0 refills | Status: DC | PRN

## 2019-02-09 NOTE — ED Notes (Signed)
Patient answered when called this time, placed in treatment room
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
## 2019-02-09 NOTE — Discharge Instructions (Addendum)
You may take Ibuprofen up to 8000 mg every 8 hours for pain and inflammation and if you need more pain relief you may add the Tramadol  Call Dr Merrilee Seashore office today for appointment this week.
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
## 2019-02-09 NOTE — ED Notes (Signed)
Patient verbalizes understanding of discharge instructions. Opportunity for questioning and answers were provided. Patient discharged from [facility] by RN.  

## 2019-02-11 ENCOUNTER — Other Ambulatory Visit: Payer: Self-pay | Admitting: Orthopedic Surgery

## 2019-02-11 ENCOUNTER — Encounter (HOSPITAL_BASED_OUTPATIENT_CLINIC_OR_DEPARTMENT_OTHER): Payer: Self-pay | Admitting: *Deleted

## 2019-02-11 ENCOUNTER — Other Ambulatory Visit: Payer: Self-pay

## 2019-02-12 ENCOUNTER — Ambulatory Visit (INDEPENDENT_AMBULATORY_CARE_PROVIDER_SITE_OTHER): Payer: Self-pay | Admitting: Family

## 2019-02-12 ENCOUNTER — Other Ambulatory Visit: Payer: Self-pay | Admitting: Orthopedic Surgery

## 2019-02-12 ENCOUNTER — Encounter: Payer: Self-pay | Admitting: Family

## 2019-02-12 DIAGNOSIS — S62309A Unspecified fracture of unspecified metacarpal bone, initial encounter for closed fracture: Secondary | ICD-10-CM

## 2019-02-12 DIAGNOSIS — B20 Human immunodeficiency virus [HIV] disease: Secondary | ICD-10-CM

## 2019-02-12 DIAGNOSIS — R0602 Shortness of breath: Secondary | ICD-10-CM | POA: Insufficient documentation
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
## 2019-02-12 NOTE — Assessment & Plan Note (Addendum)
George Rivers has had shortness of breath going on for about 2 weeks which he relates likely to smoking. He does not appear to have any symptoms consistent with pneumonia or Covid-19 at present.

## 2019-02-12 NOTE — Assessment & Plan Note (Signed)
George Rivers has good adherence to his ART regimen of Biktarvy and is well controlled with most recent viral load being suppressed. Continue current dose of Biktarvy.

## 2019-02-12 NOTE — Patient Instructions (Signed)
Telephone visit.  

## 2019-02-12 NOTE — Assessment & Plan Note (Signed)
Scheduled for ORIF today with Dr. Merlyn Lot. He has not had any fevers recently but has had night sweats which he endorses have been going on for several months. He appears to be at very low risk for coronovirus as the cause of his symptoms and from ID standpoint there would be no indication that he would not be able to proceed with surgery to repair his fracture. This information was communicated to Dr. Merlyn Lot as well.

## 2019-02-16 ENCOUNTER — Other Ambulatory Visit: Payer: Self-pay | Admitting: Internal Medicine

## 2019-02-17 NOTE — Telephone Encounter (Signed)
Hydrocodone refill. 

## 2019-02-18 NOTE — Anesthesia Preprocedure Evaluation (Addendum)
Anesthesia Evaluation  Patient identified by MRN, date of birth, ID band Patient awake    Reviewed: Allergy & Precautions, NPO status , Patient's Chart, lab work & pertinent test results  Airway Mallampati: II  TM Distance: >3 FB Neck ROM: Full    Dental no notable dental hx. (+) Missing,    Pulmonary neg pulmonary ROS, Current Smoker,    Pulmonary exam normal breath sounds clear to auscultation       Cardiovascular negative cardio ROS Normal cardiovascular exam Rhythm:Regular Rate:Normal     Neuro/Psych PSYCHIATRIC DISORDERS Depression negative neurological ROS     GI/Hepatic negative GI ROS, Neg liver ROS,   Endo/Other  negative endocrine ROS  Renal/GU negative Renal ROS  negative genitourinary   Musculoskeletal negative musculoskeletal ROS (+)   Abdominal   Peds  Hematology  (+) HIV,   Anesthesia Other Findings Right thumb fracture  Reproductive/Obstetrics                            Anesthesia Physical Anesthesia Plan  ASA: II  Anesthesia Plan: MAC and Regional   Post-op Pain Management:    Induction: Intravenous  PONV Risk Score and Plan: 1 and Propofol infusion, Ondansetron, Dexamethasone and Midazolam  Airway Management Planned: Natural Airway and Simple Face Mask  Additional Equipment:   Intra-op Plan:   Post-operative Plan:   Informed Consent: I have reviewed the patients History and Physical, chart, labs and discussed the procedure including the risks, benefits and alternatives for the proposed anesthesia with the patient or authorized representative who has indicated his/her understanding and acceptance.     Dental advisory given  Plan Discussed with: CRNA  Anesthesia Plan Comments:         Anesthesia Quick Evaluation

## 2019-02-19 ENCOUNTER — Ambulatory Visit (HOSPITAL_BASED_OUTPATIENT_CLINIC_OR_DEPARTMENT_OTHER)
Admission: RE | Admit: 2019-02-19 | Discharge: 2019-02-19 | Disposition: A | Discharge status: Home / Self Care | Payer: Self-pay | Attending: Orthopedic Surgery | Admitting: Orthopedic Surgery

## 2019-02-19 ENCOUNTER — Other Ambulatory Visit: Payer: Self-pay

## 2019-02-19 ENCOUNTER — Ambulatory Visit (HOSPITAL_BASED_OUTPATIENT_CLINIC_OR_DEPARTMENT_OTHER): Payer: Self-pay | Admitting: Anesthesiology

## 2019-02-19 ENCOUNTER — Encounter (HOSPITAL_BASED_OUTPATIENT_CLINIC_OR_DEPARTMENT_OTHER): Payer: Self-pay | Admitting: Anesthesiology

## 2019-02-19 ENCOUNTER — Encounter (HOSPITAL_BASED_OUTPATIENT_CLINIC_OR_DEPARTMENT_OTHER)
Admission: RE | Disposition: A | Discharge status: Home / Self Care | Payer: Self-pay | Source: Home / Self Care | Attending: Orthopedic Surgery

## 2019-02-19 DIAGNOSIS — X58XXXA Exposure to other specified factors, initial encounter: Secondary | ICD-10-CM | POA: Insufficient documentation

## 2019-02-19 DIAGNOSIS — Z21 Asymptomatic human immunodeficiency virus [HIV] infection status: Secondary | ICD-10-CM | POA: Insufficient documentation

## 2019-02-19 DIAGNOSIS — Z888 Allergy status to other drugs, medicaments and biological substances status: Secondary | ICD-10-CM | POA: Insufficient documentation

## 2019-02-19 DIAGNOSIS — S62241A Displaced fracture of shaft of first metacarpal bone, right hand, initial encounter for closed fracture: Secondary | ICD-10-CM | POA: Insufficient documentation

## 2019-02-19 DIAGNOSIS — Z8249 Family history of ischemic heart disease and other diseases of the circulatory system: Secondary | ICD-10-CM | POA: Insufficient documentation

## 2019-02-19 DIAGNOSIS — Z791 Long term (current) use of non-steroidal anti-inflammatories (NSAID): Secondary | ICD-10-CM | POA: Insufficient documentation

## 2019-02-19 DIAGNOSIS — F329 Major depressive disorder, single episode, unspecified: Secondary | ICD-10-CM | POA: Insufficient documentation

## 2019-02-19 DIAGNOSIS — F17210 Nicotine dependence, cigarettes, uncomplicated: Secondary | ICD-10-CM | POA: Insufficient documentation

## 2019-02-19 DIAGNOSIS — Z9109 Other allergy status, other than to drugs and biological substances: Secondary | ICD-10-CM | POA: Insufficient documentation

## 2019-02-19 DIAGNOSIS — Z885 Allergy status to narcotic agent status: Secondary | ICD-10-CM | POA: Insufficient documentation

## 2019-02-19 DIAGNOSIS — Y939 Activity, unspecified: Secondary | ICD-10-CM | POA: Insufficient documentation

## 2019-02-19 HISTORY — DX: Depression, unspecified: F32.A

## 2019-02-19 HISTORY — PX: OPEN REDUCTION INTERNAL FIXATION (ORIF) METACARPAL: SHX6234

## 2019-02-19 HISTORY — DX: Major depressive disorder, single episode, unspecified: F32.9

## 2019-02-19 SURGERY — OPEN REDUCTION INTERNAL FIXATION (ORIF) METACARPAL
Anesthesia: Monitor Anesthesia Care | Site: Thumb | Laterality: Right

## 2019-02-19 MED ORDER — ONDANSETRON HCL 4 MG/2ML IJ SOLN
INTRAMUSCULAR | Status: AC
  Filled 2019-02-19: qty 2

## 2019-02-19 MED ORDER — FENTANYL CITRATE (PF) 100 MCG/2ML IJ SOLN: 25.0000 ug | INTRAMUSCULAR | Status: DC | PRN

## 2019-02-19 MED ORDER — CLONIDINE HCL (ANALGESIA) 100 MCG/ML EP SOLN
EPIDURAL | Status: DC | PRN
  Administered 2019-02-19: 100 ug

## 2019-02-19 MED ORDER — FENTANYL CITRATE (PF) 100 MCG/2ML IJ SOLN
INTRAMUSCULAR | Status: AC
  Filled 2019-02-19: qty 2

## 2019-02-19 MED ORDER — ONDANSETRON HCL 4 MG/2ML IJ SOLN
INTRAMUSCULAR | Status: DC | PRN
  Administered 2019-02-19: 4 mg via INTRAVENOUS

## 2019-02-19 MED ORDER — PROPOFOL 500 MG/50ML IV EMUL
INTRAVENOUS | Status: DC | PRN
  Administered 2019-02-19: 50 ug/kg/min via INTRAVENOUS

## 2019-02-19 MED ORDER — FENTANYL CITRATE (PF) 100 MCG/2ML IJ SOLN
50.0000 ug | INTRAMUSCULAR | Status: DC | PRN
  Administered 2019-02-19: 100 ug via INTRAVENOUS

## 2019-02-19 MED ORDER — ACETAMINOPHEN 500 MG PO TABS
ORAL_TABLET | ORAL | Status: AC
  Filled 2019-02-19: qty 2

## 2019-02-19 MED ORDER — HYDROCODONE-ACETAMINOPHEN 5-325 MG PO TABS: ORAL_TABLET | ORAL | 0 refills | Status: DC

## 2019-02-19 MED ORDER — MIDAZOLAM HCL 2 MG/2ML IJ SOLN
1.0000 mg | INTRAMUSCULAR | Status: DC | PRN
  Administered 2019-02-19: 08:00:00 2 mg via INTRAVENOUS

## 2019-02-19 MED ORDER — DEXAMETHASONE SODIUM PHOSPHATE 10 MG/ML IJ SOLN
INTRAMUSCULAR | Status: AC
  Filled 2019-02-19: qty 1

## 2019-02-19 MED ORDER — LIDOCAINE 2% (20 MG/ML) 5 ML SYRINGE
INTRAMUSCULAR | Status: AC
  Filled 2019-02-19: qty 5

## 2019-02-19 MED ORDER — DEXAMETHASONE SODIUM PHOSPHATE 4 MG/ML IJ SOLN
INTRAMUSCULAR | Status: DC | PRN
  Administered 2019-02-19: 5 mg via INTRAVENOUS

## 2019-02-19 MED ORDER — LACTATED RINGERS IV SOLN
INTRAVENOUS | Status: DC
  Administered 2019-02-19: 08:00:00 via INTRAVENOUS

## 2019-02-19 MED ORDER — MIDAZOLAM HCL 2 MG/2ML IJ SOLN
INTRAMUSCULAR | Status: AC
  Filled 2019-02-19: qty 2

## 2019-02-19 MED ORDER — PROPOFOL 500 MG/50ML IV EMUL
INTRAVENOUS | Status: AC
  Filled 2019-02-19: qty 50

## 2019-02-19 MED ORDER — ROPIVACAINE HCL 5 MG/ML IJ SOLN
INTRAMUSCULAR | Status: DC | PRN
  Administered 2019-02-19: 20 mL via PERINEURAL

## 2019-02-19 MED ORDER — PROPOFOL 10 MG/ML IV BOLUS
INTRAVENOUS | Status: AC
  Filled 2019-02-19: qty 20

## 2019-02-19 MED ORDER — CEFAZOLIN SODIUM-DEXTROSE 2-4 GM/100ML-% IV SOLN
INTRAVENOUS | Status: AC
  Filled 2019-02-19: qty 100

## 2019-02-19 MED ORDER — CHLORHEXIDINE GLUCONATE 4 % EX LIQD: 60.0000 mL | Freq: Once | CUTANEOUS | Status: DC

## 2019-02-19 MED ORDER — CEFAZOLIN SODIUM-DEXTROSE 2-4 GM/100ML-% IV SOLN
2.0000 g | INTRAVENOUS | Status: AC
  Administered 2019-02-19: 09:00:00 2 g via INTRAVENOUS

## 2019-02-19 MED ORDER — ACETAMINOPHEN 500 MG PO TABS
1000.0000 mg | ORAL_TABLET | Freq: Once | ORAL | Status: AC
  Administered 2019-02-19: 1000 mg via ORAL

## 2019-02-19 MED ORDER — SCOPOLAMINE 1 MG/3DAYS TD PT72: 1.0000 | MEDICATED_PATCH | Freq: Once | TRANSDERMAL | Status: DC | PRN

## 2019-02-19 SURGICAL SUPPLY — 66 items
APL PRP STRL LF DISP 70% ISPRP (MISCELLANEOUS) ×1
BANDAGE ACE 3X5.8 VEL STRL LF (GAUZE/BANDAGES/DRESSINGS) ×3 IMPLANT
BIT DRILL 1.1 (BIT) ×2
BIT DRILL 1.1MM (BIT) ×1
BIT DRILL 60X20X1.1XQC TMX (BIT) IMPLANT
BIT DRL 60X20X1.1XQC TMX (BIT) ×1
BLADE SURG 15 STRL LF DISP TIS (BLADE) ×2 IMPLANT
BLADE SURG 15 STRL SS (BLADE) ×6
BNDG CMPR 9X4 STRL LF SNTH (GAUZE/BANDAGES/DRESSINGS) ×1
BNDG ESMARK 4X9 LF (GAUZE/BANDAGES/DRESSINGS) ×3 IMPLANT
BNDG GAUZE ELAST 4 BULKY (GAUZE/BANDAGES/DRESSINGS) ×3 IMPLANT
CHLORAPREP W/TINT 26 (MISCELLANEOUS) ×3 IMPLANT
CORD BIPOLAR FORCEPS 12FT (ELECTRODE) ×3 IMPLANT
COVER BACK TABLE REUSABLE LG (DRAPES) ×3 IMPLANT
COVER MAYO STAND REUSABLE (DRAPES) ×3 IMPLANT
COVER WAND RF STERILE (DRAPES) IMPLANT
CUFF TOURN SGL QUICK 18X4 (TOURNIQUET CUFF) ×3 IMPLANT
DRAPE EXTREMITY T 121X128X90 (DISPOSABLE) ×3 IMPLANT
DRAPE OEC MINIVIEW 54X84 (DRAPES) ×3 IMPLANT
DRAPE SURG 17X23 STRL (DRAPES) ×3 IMPLANT
GAUZE SPONGE 4X4 12PLY STRL (GAUZE/BANDAGES/DRESSINGS) ×3 IMPLANT
GAUZE XEROFORM 1X8 LF (GAUZE/BANDAGES/DRESSINGS) ×3 IMPLANT
GLOVE BIO SURGEON STRL SZ7.5 (GLOVE) ×5 IMPLANT
GLOVE BIOGEL PI IND STRL 8 (GLOVE) ×1 IMPLANT
GLOVE BIOGEL PI IND STRL 8.5 (GLOVE) IMPLANT
GLOVE BIOGEL PI INDICATOR 8 (GLOVE) ×2
GLOVE BIOGEL PI INDICATOR 8.5 (GLOVE) ×2
GLOVE SURG ORTHO 8.0 STRL STRW (GLOVE) ×2 IMPLANT
GLOVE SURG SS PI 6.5 STRL IVOR (GLOVE) ×2 IMPLANT
GOWN STRL REUS W/ TWL LRG LVL3 (GOWN DISPOSABLE) ×1 IMPLANT
GOWN STRL REUS W/TWL LRG LVL3 (GOWN DISPOSABLE) ×3
GOWN STRL REUS W/TWL XL LVL3 (GOWN DISPOSABLE) ×3 IMPLANT
NDL HYPO 25X1 1.5 SAFETY (NEEDLE) IMPLANT
NEEDLE HYPO 25X1 1.5 SAFETY (NEEDLE) IMPLANT
NON LOCK SCREW 1.5X24MM (Screw) ×6 IMPLANT
NS IRRIG 1000ML POUR BTL (IV SOLUTION) ×3 IMPLANT
PACK BASIN DAY SURGERY FS (CUSTOM PROCEDURE TRAY) ×3 IMPLANT
PAD CAST 4YDX4 CTTN HI CHSV (CAST SUPPLIES) ×1 IMPLANT
PADDING CAST COTTON 4X4 STRL (CAST SUPPLIES) ×3
PLATE STRAIGHT LOCK 1.5 (Plate) ×2 IMPLANT
SCREW 1.5X15MM (Screw) ×2 IMPLANT
SCREW 1.5X18MM (Screw) ×6 IMPLANT
SCREW BN 18X1.5XST NONLOCK (Screw) IMPLANT
SCREW LOCKING 1.5X18MM (Screw) ×2 IMPLANT
SCREW NL 1.5X12 (Screw) ×2 IMPLANT
SCREW NL 1.5X13 (Screw) ×6 IMPLANT
SCREW NON LOCK 1.5X24MM (Screw) IMPLANT
SCREW NONIOC 1.5 14M (Screw) ×6 IMPLANT
SCREW NONIOC 1.5 16M (Screw) ×2 IMPLANT
SLEEVE SCD COMPRESS KNEE MED (MISCELLANEOUS) ×2 IMPLANT
SPLINT PLASTER CAST XFAST 3X15 (CAST SUPPLIES) IMPLANT
SPLINT PLASTER CAST XFAST 4X15 (CAST SUPPLIES) IMPLANT
SPLINT PLASTER XTRA FAST SET 4 (CAST SUPPLIES)
SPLINT PLASTER XTRA FASTSET 3X (CAST SUPPLIES)
STOCKINETTE 4X48 STRL (DRAPES) ×3 IMPLANT
SUT CHROMIC 4 0 PS 2 18 (SUTURE) ×3 IMPLANT
SUT ETHILON 3 0 PS 1 (SUTURE) IMPLANT
SUT ETHILON 4 0 PS 2 18 (SUTURE) ×3 IMPLANT
SUT MERSILENE 4 0 P 3 (SUTURE) IMPLANT
SUT VIC AB 3-0 PS1 18 (SUTURE)
SUT VIC AB 3-0 PS1 18XBRD (SUTURE) IMPLANT
SUT VICRYL 4-0 PS2 18IN ABS (SUTURE) ×3 IMPLANT
SYR BULB 3OZ (MISCELLANEOUS) ×3 IMPLANT
SYR CONTROL 10ML LL (SYRINGE) IMPLANT
TOWEL GREEN STERILE FF (TOWEL DISPOSABLE) ×6 IMPLANT
UNDERPAD 30X30 (UNDERPADS AND DIAPERS) ×3 IMPLANT

## 2019-02-19 NOTE — Anesthesia Postprocedure Evaluation (Signed)
Anesthesia Post Note  Patient: George Rivers  Procedure(s) Performed: OPEN REDUCTION INTERNAL FIXATION (ORIF)RIGHT THUMB METACARPAL (Right Thumb)     Patient location during evaluation: PACU Anesthesia Type: Regional and MAC Level of consciousness: awake and alert Pain management: pain level controlled Vital Signs Assessment: post-procedure vital signs reviewed and stable Respiratory status: spontaneous breathing, nonlabored ventilation, respiratory function stable and patient connected to nasal cannula oxygen Cardiovascular status: stable and blood pressure returned to baseline Postop Assessment: no apparent nausea or vomiting Anesthetic complications: no    Last Vitals:  Vitals:   02/19/19 1100 02/19/19 1114  BP: 97/70 (!) 107/54  Pulse: (!) 50   Resp: 15   Temp:    SpO2: 100%     Last Pain:  Vitals:   02/19/19 1114  TempSrc:   PainSc: 0-No pain                 Chelsey L Woodrum

## 2019-02-19 NOTE — Anesthesia Procedure Notes (Signed)
Anesthesia Regional Block: Supraclavicular block   Pre-Anesthetic Checklist: ,, timeout performed, Correct Patient, Correct Site, Correct Laterality, Correct Procedure, Correct Position, site marked, Risks and benefits discussed,  Surgical consent,  Pre-op evaluation,  At surgeon's request and post-op pain management  Laterality: Right  Prep: Maximum Sterile Barrier Precautions used, chloraprep       Needles:  Injection technique: Single-shot  Needle Type: Echogenic Stimulator Needle     Needle Length: 5cm  Needle Gauge: 22     Additional Needles:   Procedures:,,,, ultrasound used (permanent image in chart),,,,  Narrative:  Start time: 02/19/2019 8:19 AM End time: 02/19/2019 8:29 AM Injection made incrementally with aspirations every 5 mL.  Performed by: Personally  Anesthesiologist: Elmer Picker, MD  Additional Notes: Monitors applied. No increased pain on injection. No increased resistance to injection. Injection made in 5cc increments. Good needle visualization. Patient tolerated procedure well.

## 2019-02-19 NOTE — Op Note (Signed)
I assisted Surgeon(s) and Role:    * Betha Loa, MD - Primary    Cindee Salt, MD - Assisting on the Procedure(s): OPEN REDUCTION INTERNAL FIXATION (ORIF)RIGHT THUMB METACARPAL on 02/19/2019.  I provided assistance on this case as follows: Set up, approach, isolation of the fractured, debridement of the fracture, reduction of the fracture, stabilization and fixation of the fracture, closure of the wound and application dressing and splint.  Electronically signed by: Cindee Salt, MD Date: 02/19/2019 Time: 10:18 AM

## 2019-02-19 NOTE — Discharge Instructions (Addendum)

## 2019-02-19 NOTE — Transfer of Care (Signed)
Immediate Anesthesia Transfer of Care Note  Patient: George Rivers  Procedure(s) Performed: Procedure(s) (LRB): OPEN REDUCTION INTERNAL FIXATION (ORIF)RIGHT THUMB METACARPAL (Right)  Patient Location: PACU  Anesthesia Type: General  Level of Consciousness: awake, sedated, patient cooperative and responds to stimulation  Airway & Oxygen Therapy: Patient Spontanous Breathing and Patient connected to face mask oxygen  Post-op Assessment: Report given to PACU RN, Post -op Vital signs reviewed and stable and Patient moving all extremities  Post vital signs: Reviewed and stable  Complications: No apparent anesthesia complications

## 2019-02-19 NOTE — Progress Notes (Signed)
Assisted D. Woodrum with right, ultrasound guided, interscalene  block. Side rails up, monitors on throughout procedure. See vital signs in flow sheet. Tolerated Procedure well.

## 2019-02-19 NOTE — Anesthesia Procedure Notes (Signed)
Procedure Name: MAC Date/Time: 02/19/2019 8:48 AM Performed by: Justice Rocher, CRNA Pre-anesthesia Checklist: Patient identified, Emergency Drugs available, Suction available, Patient being monitored and Timeout performed Patient Re-evaluated:Patient Re-evaluated prior to induction Oxygen Delivery Method: Simple face mask Preoxygenation: Pre-oxygenation with 100% oxygen Induction Type: IV induction Placement Confirmation: positive ETCO2 and breath sounds checked- equal and bilateral
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
## 2019-02-23 ENCOUNTER — Encounter (HOSPITAL_BASED_OUTPATIENT_CLINIC_OR_DEPARTMENT_OTHER): Payer: Self-pay | Admitting: Orthopedic Surgery

## 2019-02-23 NOTE — Op Note (Signed)
Intra-operative fluoroscopic images in the AP, lateral, and oblique views were taken and evaluated by myself.  Reduction and hardware placement were confirmed.  There was no intraarticular penetration of permanent hardware.  

## 2019-05-18 ENCOUNTER — Other Ambulatory Visit: Payer: Self-pay | Admitting: Internal Medicine

## 2019-05-18 DIAGNOSIS — B20 Human immunodeficiency virus [HIV] disease: Secondary | ICD-10-CM

## 2019-07-15 ENCOUNTER — Emergency Department (HOSPITAL_COMMUNITY)
Admission: EM | Admit: 2019-07-15 | Discharge: 2019-07-15 | Disposition: A | Discharge status: Home / Self Care | Payer: Medicaid Other | Attending: Emergency Medicine | Admitting: Emergency Medicine

## 2019-07-15 ENCOUNTER — Encounter (HOSPITAL_COMMUNITY): Payer: Self-pay | Admitting: Emergency Medicine

## 2019-07-15 DIAGNOSIS — Y92410 Unspecified street and highway as the place of occurrence of the external cause: Secondary | ICD-10-CM | POA: Insufficient documentation

## 2019-07-15 DIAGNOSIS — Z79899 Other long term (current) drug therapy: Secondary | ICD-10-CM | POA: Insufficient documentation

## 2019-07-15 DIAGNOSIS — Y998 Other external cause status: Secondary | ICD-10-CM | POA: Insufficient documentation

## 2019-07-15 DIAGNOSIS — Y93I9 Activity, other involving external motion: Secondary | ICD-10-CM | POA: Insufficient documentation

## 2019-07-15 DIAGNOSIS — M7918 Myalgia, other site: Secondary | ICD-10-CM

## 2019-07-15 DIAGNOSIS — S79911A Unspecified injury of right hip, initial encounter: Secondary | ICD-10-CM | POA: Insufficient documentation

## 2019-07-15 DIAGNOSIS — F17210 Nicotine dependence, cigarettes, uncomplicated: Secondary | ICD-10-CM | POA: Insufficient documentation

## 2019-07-15 DIAGNOSIS — Z21 Asymptomatic human immunodeficiency virus [HIV] infection status: Secondary | ICD-10-CM | POA: Insufficient documentation

## 2019-07-15 DIAGNOSIS — S199XXA Unspecified injury of neck, initial encounter: Secondary | ICD-10-CM | POA: Insufficient documentation

## 2019-07-15 MED ORDER — CYCLOBENZAPRINE HCL 5 MG PO TABS: 5.0000 mg | ORAL_TABLET | Freq: Two times a day (BID) | ORAL | 0 refills | Status: AC | PRN

## 2019-07-15 NOTE — ED Notes (Signed)
Pt ambulated to restroom. 
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
## 2019-07-15 NOTE — Discharge Instructions (Addendum)
Please read attached information. If you experience any new or worsening signs or symptoms please return to the emergency room for evaluation. Please follow-up with your primary care provider or specialist as discussed. Please use medication prescribed only as directed and discontinue taking if you have any concerning signs or symptoms.   °

## 2019-07-15 NOTE — ED Notes (Signed)
All appropriate discharge materials reviewed at length with patient. Time for questions provided. Pt has no other questions at this time and verbalizes understanding of all provided materials.  

## 2019-08-26 ENCOUNTER — Encounter: Payer: Self-pay | Admitting: Family

## 2019-08-26 ENCOUNTER — Ambulatory Visit: Payer: Medicaid Other

## 2019-08-26 ENCOUNTER — Other Ambulatory Visit: Payer: Medicaid Other

## 2019-08-26 ENCOUNTER — Telehealth: Payer: Self-pay | Admitting: Pharmacy Technician

## 2019-08-26 ENCOUNTER — Other Ambulatory Visit: Payer: Self-pay

## 2019-08-26 ENCOUNTER — Other Ambulatory Visit (HOSPITAL_COMMUNITY)
Admission: RE | Admit: 2019-08-26 | Discharge: 2019-08-26 | Disposition: A | Discharge status: Home / Self Care | Payer: Medicaid Other | Source: Ambulatory Visit | Attending: Internal Medicine | Admitting: Internal Medicine

## 2019-08-26 DIAGNOSIS — Z113 Encounter for screening for infections with a predominantly sexual mode of transmission: Secondary | ICD-10-CM | POA: Insufficient documentation

## 2019-08-26 DIAGNOSIS — B20 Human immunodeficiency virus [HIV] disease: Secondary | ICD-10-CM | POA: Diagnosis present

## 2019-08-26 DIAGNOSIS — Z79899 Other long term (current) drug therapy: Secondary | ICD-10-CM

## 2019-08-26 NOTE — Telephone Encounter (Addendum)
[facility] Patient Advocate Encounter  Completed and sent [employer] Advancing Access application for George Rivers for this patient who is uninsured.    Patient is approved 08/26/2019 through 08/25/2020.  BIN      [id number] PCN    [id number] GRP    [id number] ID        [id number]  This will bridge him until he is approved for HMAP.  I shared this with [employer].  They will ship the medication to his home.   Venida Jarvis. Nadara Mustard [place] Patient [facility]ious [facility] Phone: [phone] Fax:  [phone]

## 2019-08-27 ENCOUNTER — Telehealth: Payer: Self-pay

## 2019-08-27 LAB — T-HELPER CELL (CD4) - (RCID CLINIC ONLY)
CD4 % Helper T Cell: 34 % (ref 33–65)
CD4 T Cell Abs: 961 /uL (ref 400–1790)

## 2019-08-27 NOTE — Telephone Encounter (Signed)
Received refill request for patient's Biktarvy from [facility]. Spoke with pharmacy tech who states patient has a refill ready to pick up. Last refill was mailed on 6/25 30 day supply.  Advised pharmacy to go ahead and refill prescription. [place]

## 2019-08-31 ENCOUNTER — Other Ambulatory Visit: Payer: Self-pay | Admitting: *Deleted

## 2019-08-31 DIAGNOSIS — B20 Human immunodeficiency virus [HIV] disease: Secondary | ICD-10-CM

## 2019-08-31 LAB — CBC WITH DIFFERENTIAL/PLATELET
Absolute Monocytes: 683 cells/uL (ref 200–950)
Basophils Absolute: 21 cells/uL (ref 0–200)
Basophils Relative: 0.3 %
Eosinophils Absolute: 221 cells/uL (ref 15–500)
Eosinophils Relative: 3.2 %
HCT: 44.3 % (ref 38.5–50.0)
Hemoglobin: 15 g/dL (ref 13.2–17.1)
Lymphs Abs: 2933 cells/uL (ref 850–3900)
MCH: 31.3 pg (ref 27.0–33.0)
MCHC: 33.9 g/dL (ref 32.0–36.0)
MCV: 92.5 fL (ref 80.0–100.0)
MPV: 9.3 fL (ref 7.5–12.5)
Monocytes Relative: 9.9 %
Neutro Abs: 3043 cells/uL (ref 1500–7800)
Neutrophils Relative %: 44.1 %
Platelets: 237 10*3/uL (ref 140–400)
RBC: 4.79 10*6/uL (ref 4.20–5.80)
RDW: 14.2 % (ref 11.0–15.0)
Total Lymphocyte: 42.5 %
WBC: 6.9 10*3/uL (ref 3.8–10.8)

## 2019-08-31 LAB — COMPLETE METABOLIC PANEL WITH GFR
AG Ratio: 1.7 (calc) (ref 1.0–2.5)
ALT: 14 U/L (ref 9–46)
AST: 22 U/L (ref 10–40)
Albumin: 4.3 g/dL (ref 3.6–5.1)
Alkaline phosphatase (APISO): 58 U/L (ref 36–130)
BUN: 12 mg/dL (ref 7–25)
CO2: 29 mmol/L (ref 20–32)
Calcium: 9.6 mg/dL (ref 8.6–10.3)
Chloride: 107 mmol/L (ref 98–110)
Creat: 1.21 mg/dL (ref 0.60–1.35)
GFR, Est African American: 91 mL/min/{1.73_m2} (ref >=60)
GFR, Est Non African American: 79 mL/min/{1.73_m2} (ref >=60)
Globulin: 2.5 g/dL (calc) (ref 1.9–3.7)
Glucose, Bld: 113 mg/dL — ABNORMAL HIGH (ref 65–99)
Potassium: 4 mmol/L (ref 3.5–5.3)
Sodium: 140 mmol/L (ref 135–146)
Total Bilirubin: 0.7 mg/dL (ref 0.2–1.2)
Total Protein: 6.8 g/dL (ref 6.1–8.1)

## 2019-08-31 LAB — LIPID PANEL
Cholesterol: 116 mg/dL (ref <200)
HDL: 37 mg/dL — ABNORMAL LOW (ref >=40)
LDL Cholesterol (Calc): 63 mg/dL (calc)
Non-HDL Cholesterol (Calc): 79 mg/dL (calc) (ref <130)
Total CHOL/HDL Ratio: 3.1 (calc) (ref <5.0)
Triglycerides: 76 mg/dL (ref <150)

## 2019-08-31 LAB — RPR: RPR Ser Ql: NONREACTIVE

## 2019-08-31 LAB — HIV-1 RNA QUANT-NO REFLEX-BLD
HIV 1 RNA Quant: <20 copies/mL
HIV-1 RNA Quant, Log: <1.3 Log copies/mL

## 2019-08-31 MED ORDER — BIKTARVY 50-200-25 MG PO TABS: 1.0000 | ORAL_TABLET | Freq: Every day | ORAL | 0 refills | Status: DC

## 2019-09-03 LAB — URINE CYTOLOGY ANCILLARY ONLY
Chlamydia: NEGATIVE
Comment: NEGATIVE
Comment: NORMAL
Neisseria Gonorrhea: NEGATIVE

## 2019-09-15 ENCOUNTER — Encounter: Payer: Medicaid Other | Admitting: Family

## 2019-09-17 ENCOUNTER — Ambulatory Visit: Payer: Medicaid Other | Admitting: Family

## 2019-09-30 ENCOUNTER — Other Ambulatory Visit: Payer: Self-pay | Admitting: Internal Medicine

## 2019-09-30 ENCOUNTER — Telehealth: Payer: Self-pay

## 2019-09-30 DIAGNOSIS — B20 Human immunodeficiency virus [HIV] disease: Secondary | ICD-10-CM

## 2019-09-30 NOTE — Telephone Encounter (Signed)
Attempted to call patient to schedule overdue appointment with our office. Patient has seen a provider since 01/2019. Labs were done in 10/20. Left voicemail requesting patient call office back for an appointment.  [place]

## 2019-10-20 DEATH — deceased
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
